# Patient Record
Sex: Male | Born: 1965 | Race: White | Hispanic: No | Marital: Married | Smoking: Never smoker
Health system: Southern US, Community
[De-identification: ages and names within clinical notes are randomized; demographics above are authoritative.]

## PROBLEM LIST (undated history)

## (undated) ENCOUNTER — Emergency Department (HOSPITAL_BASED_OUTPATIENT_CLINIC_OR_DEPARTMENT_OTHER): Admission: EM | Payer: Self-pay | Source: Home / Self Care

---

## 2013-06-13 ENCOUNTER — Encounter (HOSPITAL_BASED_OUTPATIENT_CLINIC_OR_DEPARTMENT_OTHER): Payer: Self-pay

## 2013-06-13 ENCOUNTER — Emergency Department (HOSPITAL_BASED_OUTPATIENT_CLINIC_OR_DEPARTMENT_OTHER): Payer: Self-pay

## 2013-06-13 ENCOUNTER — Emergency Department (HOSPITAL_BASED_OUTPATIENT_CLINIC_OR_DEPARTMENT_OTHER)
Admission: EM | Admit: 2013-06-13 | Discharge: 2013-06-13 | Disposition: A | Discharge status: Home / Self Care | Payer: Self-pay | Attending: Emergency Medicine | Admitting: Emergency Medicine

## 2013-06-13 DIAGNOSIS — S0990XA Unspecified injury of head, initial encounter: Secondary | ICD-10-CM

## 2013-06-13 DIAGNOSIS — Y9389 Activity, other specified: Secondary | ICD-10-CM | POA: Insufficient documentation

## 2013-06-13 DIAGNOSIS — B029 Zoster without complications: Secondary | ICD-10-CM | POA: Insufficient documentation

## 2013-06-13 DIAGNOSIS — W1789XA Other fall from one level to another, initial encounter: Secondary | ICD-10-CM | POA: Insufficient documentation

## 2013-06-13 DIAGNOSIS — Y929 Unspecified place or not applicable: Secondary | ICD-10-CM | POA: Insufficient documentation

## 2013-06-13 MED ORDER — PREDNISONE 10 MG PO TABS: 20.0000 mg | ORAL_TABLET | Freq: Two times a day (BID) | ORAL | Status: AC

## 2013-06-13 MED ORDER — VALACYCLOVIR HCL 1 G PO TABS: 1000.0000 mg | ORAL_TABLET | Freq: Three times a day (TID) | ORAL | Status: AC

## 2013-06-13 NOTE — ED Provider Notes (Signed)
   History    CSN: [id number] Arrival date & time 06/13/13  1401  First MD Initiated Contact with Patient 06/13/13 1409     Chief Complaint  Patient presents with  . Head Injury   (Consider location/radiation/quality/duration/timing/severity/associated sxs/prior Treatment) HPI Comments: Patient was sitting in a hammock 9 days ago when the hook came out of the tree and he fell to the ground.  He struck his head but was not knocked out.  He reports having a headache since that time.  No visual changes.  No nausea or vomiting.    Patient is a 47 y.o. male presenting with head injury. The history is provided by the patient.  Head Injury Location:  Occipital Time since incident:  9 days Mechanism of injury: fall   Pain details:    Quality:  Stabbing   Severity:  Moderate   Timing:  Constant   Progression:  Unchanged Chronicity:  New Relieved by:  Nothing Worsened by:  Nothing tried Ineffective treatments:  None tried  History reviewed. No pertinent past medical history. History reviewed. No pertinent past surgical history. No family history on file. History  Substance Use Topics  . Smoking status: Never Smoker   . Smokeless tobacco: Not on file  . Alcohol Use: No    Review of Systems  All other systems reviewed and are negative.    Allergies  Review of patient's allergies indicates no known allergies.  Home Medications  No current outpatient prescriptions on file. There were no vitals taken for this visit. Physical Exam  Nursing note and vitals reviewed. Constitutional: He is oriented to person, place, and time. He appears well-developed and well-nourished. No distress.  HENT:  Head: Normocephalic and atraumatic.  Mouth/Throat: Oropharynx is clear and moist.  Eyes: EOM are normal. Pupils are equal, round, and reactive to light.  Neck: Normal range of motion. Neck supple.  Cardiovascular: Regular rhythm.   Pulmonary/Chest: Effort normal.  Musculoskeletal: Normal  range of motion.  Lymphadenopathy:    He has no cervical adenopathy.  Neurological: He is alert and oriented to person, place, and time. No cranial nerve deficit. He exhibits normal muscle tone. Coordination normal.  Skin: Skin is warm and dry. He is not diaphoretic.  There is a vesicular rash to the right side of the occiput and upper neck.    ED Course  Procedures (including critical care time) Labs Reviewed - No data to display No results found. No diagnosis found.  MDM  The ct of the head is negative for traumatic injury.  There appears to be what I believe are lesions related to a zoster to the posterior aspect of the head in the same area where the pain is location.  I suspect this is likely the cause of the discomfort and that the fall was likely incidental.  Will treat with acyclovir and prednisone, discharge, return prn.  Geoffery Lyons, MD 06/13/13 [id number]

## 2013-06-13 NOTE — Discharge Instructions (Signed)
Concussion and Brain Injury  A blow or jolt to the head can disrupt the normal function of the brain. This type of brain injury is often called a "concussion" or a "closed head injury." Concussions are usually not life-threatening. Even so, the effects of a concussion can be serious.   CAUSES   A concussion is caused by a blunt blow to the head. The blow might be direct or indirect as described below.  · Direct blow (running into another player during a soccer game, being hit in a fight, or hitting your head on a hard surface).  · Indirect blow (when your head moves rapidly and violently back and forth like in a car crash).  SYMPTOMS   The brain is very complex. Every head injury is different. Some symptoms may appear right away. Other symptoms may not show up for days or weeks after the concussion. The signs of concussion can be hard to notice. Early on, problems may be missed by patients, family members, and caregivers. You may look fine even though you are acting or feeling differently.   These symptoms are usually temporary, but may last for days, weeks, or even longer. Symptoms include:  · Mild headaches that will not go away.  · Having more trouble than usual with:  · Remembering things.  · Paying attention or concentrating.  · Organizing daily tasks.  · Making decisions and solving problems.  · Slowness in thinking, acting, speaking, or reading.  · Getting lost or easily confused.  · Feeling tired all the time or lacking energy (fatigue).  · Feeling drowsy.  · Sleep disturbances.  · Sleeping more than usual.  · Sleeping less than usual.  · Trouble falling asleep.  · Trouble sleeping (insomnia).  · Loss of balance or feeling lightheaded or dizzy.  · Nausea or vomiting.  · Numbness or tingling.  · Increased sensitivity to:  · Sounds.  · Lights.  · Distractions.  Other symptoms might include:  · Vision problems or eyes that tire easily.  · Diminished sense of taste or smell.  · Ringing in the ears.  · Mood  changes such as feeling sad, anxious, or listless.  · Becoming easily irritated or angry for little or no reason.  · Lack of motivation.  DIAGNOSIS   Your caregiver can usually diagnose a concussion or mild brain injury based on your description of your injury and your symptoms.   Your evaluation might include:  · A brain scan to look for signs of injury to the brain. Even if the test shows no injury, you may still have a concussion.  · Blood tests to be sure other problems are not present.  TREATMENT   · People with a concussion need to be examined and evaluated. Most people with concussions are treated in an emergency department, urgent care, or clinic. Some people must stay in the hospital overnight for further treatment.  · Your caregiver will send you home with important instructions to follow. Be sure to carefully follow them.  · Tell your caregiver if you are already taking any medicines (prescription, over-the-counter, or natural remedies), or if you are drinking alcohol or taking illegal drugs. Also, talk with your caregiver if you are taking blood thinners (anticoagulants) or aspirin. These drugs may increase your chances of complications. All of this is important information that may affect treatment.  · Only take over-the-counter or prescription medicines for pain, discomfort, or fever as directed by your caregiver.  PROGNOSIS     How fast people recover from brain injury varies from person to person. Although most people have a good recovery, how quickly they improve depends on many factors. These factors include how severe their concussion was, what part of the brain was injured, their age, and how healthy they were before the concussion.   Because all head injuries are different, so is recovery. Most people with mild injuries recover fully. Recovery can take time. In general, recovery is slower in older persons. Also, persons who have had a concussion in the past or have other medical problems may find  that it takes longer to recover from their current injury. Anxiety and depression may also make it harder to adjust to the symptoms of brain injury.  HOME CARE INSTRUCTIONS   Return to your normal activities slowly, not all at once. You must give your body and brain enough time for recovery.  · Get plenty of sleep at night, and rest during the day. Rest helps the brain to heal.  · Avoid staying up late at night.  · Keep the same bedtime hours on weekends and weekdays.  · Take daytime naps or rest breaks when you feel tired.  · Limit activities that require a lot of thought or concentration (brain or cognitive rest). This includes:  · Homework or job-related work.  · Watching TV.  · Computer work.  · Avoid activities that could lead to a second brain injury, such as contact or recreational sports, until your caregiver says it is okay. Even after your brain injury has healed, you should protect yourself from having another concussion.  · Ask your caregiver when you can return to your normal activities such as driving, bicycling, or operating heavy equipment. Your ability to react may be slower after a brain injury.  · Talk with your caregiver about when you can return to work or school.  · Inform your teachers, school nurse, school counselor, coach, athletic trainer, or work manager about your injury, symptoms, and restrictions. They should be instructed to report:  · Increased problems with attention or concentration.  · Increased problems remembering or learning new information.  · Increased time needed to complete tasks or assignments.  · Increased irritability or decreased ability to cope with stress.  · Increased symptoms.  · Take only those medicines that your caregiver has approved.  · Do not drink alcohol until your caregiver says you are well enough to do so. Alcohol and certain other drugs may slow your recovery and can put you at risk of further injury.  · If it is harder than usual to remember things,  write them down.  · If you are easily distracted, try to do one thing at a time. For example, do not try to watch TV while fixing dinner.  · Talk with family members or close friends when making important decisions.  · Keep all follow-up appointments. Repeated evaluation of your symptoms is recommended for your recovery.  PREVENTION   Protect your head from future injury. It is very important to avoid another head or brain injury before you have recovered. In rare cases, another injury has lead to permanent brain damage, brain swelling, or death. Avoid injuries by using:  · Seatbelts when riding in a car.  · Alcohol only in moderation.  · A helmet when biking, skiing, skateboarding, skating, or doing similar activities.  · Safety measures in your home.  · Remove clutter and tripping hazards from floors and stairways.  · Use grab   Depression or mood swings.  Anxiety or irritability.  Memory problems.  Difficulty concentrating or paying attention.  Sleep problems.  Feeling tired all the time. SEEK IMMEDIATE MEDICAL CARE IF:  You have had a blow or jolt to the head and you (or your family or friends) notice:  Severe or worsening headaches.  Weakness (even if only in one hand or one leg or one part of the face), numbness, or decreased coordination.  Repeated vomiting.  Increased sleepiness or passing out.  One black center of the eye (pupil) is larger than the other.  Convulsions (seizures).  Slurred speech.  Increasing  confusion, restlessness, agitation, or irritability.  Lack of ability to recognize people or places.  Neck pain.  Difficulty being awakened.  Unusual behavior changes.  Loss of consciousness. Older adults with a brain injury may have a higher risk of serious complications such as a blood clot on the brain. Headaches that get worse or an increase in confusion are signs of this complication. If these signs occur, see a caregiver right away. MAKE SURE YOU:   Understand these instructions.  Will watch your condition.  Will get help right away if you are not doing well or get worse. FOR MORE INFORMATION  Several groups help people with brain injury and their families. They provide information and put people in touch with local resources. These include support groups, rehabilitation services, and a variety of health care professionals. Among these groups, the Brain Injury Association (BIA, www.biausa.org) has a Secretary/administrator that gathers scientific and educational information and works on a national level to help people with brain injury.  Document Released: 02/21/2004 Document Revised: 02/23/2012 Document Reviewed: 07/19/2008 [facility] Patient Information 2014 [place], [place].  Shingles Shingles (herpes zoster) is an infection that is caused by the same virus that causes chickenpox (varicella). The infection causes a painful skin rash and fluid-filled blisters, which eventually break open, crust over, and heal. It may occur in any area of the body, but it usually affects only one side of the body or face. The pain of shingles usually lasts about 1 month. However, some people with shingles may develop long-term (chronic) pain in the affected area of the body. Shingles often occurs many years after the person had chickenpox. It is more common:  In people older than 50 years.  In people with weakened immune systems, such as those with HIV, AIDS, or cancer.  In people taking medicines that  weaken the immune system, such as transplant medicines.  In people under great stress. CAUSES  Shingles is caused by the varicella zoster virus (VZV), which also causes chickenpox. After a person is infected with the virus, it can remain in the person's body for years in an inactive state (dormant). To cause shingles, the virus reactivates and breaks out as an infection in a nerve root. The virus can be spread from person to person (contagious) through contact with open blisters of the shingles rash. It will only spread to people who have not had chickenpox. When these people are exposed to the virus, they may develop chickenpox. They will not develop shingles. Once the blisters scab over, the person is no longer contagious and cannot spread the virus to others. SYMPTOMS  Shingles shows up in stages. The initial symptoms may be pain, itching, and tingling in an area of the skin. This pain is usually described as burning, stabbing, or throbbing.In a few days or weeks, a painful red rash will appear in the area where  the pain, itching, and tingling were felt. The rash is usually on one side of the body in a band or belt-like pattern. Then, the rash usually turns into fluid-filled blisters. They will scab over and dry up in approximately 2 3 weeks. Flu-like symptoms may also occur with the initial symptoms, the rash, or the blisters. These may include:  Fever.  Chills.  Headache.  Upset stomach. DIAGNOSIS  Your caregiver will perform a skin exam to diagnose shingles. Skin scrapings or fluid samples may also be taken from the blisters. This sample will be examined under a microscope or sent to a lab for further testing. TREATMENT  There is no specific cure for shingles. Your caregiver will likely prescribe medicines to help you manage the pain, recover faster, and avoid long-term problems. This may include antiviral drugs, anti-inflammatory drugs, and pain medicines. HOME CARE INSTRUCTIONS   Take  a cool bath or apply cool compresses to the area of the rash or blisters as directed. This may help with the pain and itching.   Only take over-the-counter or prescription medicines as directed by your caregiver.   Rest as directed by your caregiver.  Keep your rash and blisters clean with mild soap and cool water or as directed by your caregiver.  Do not pick your blisters or scratch your rash. Apply an anti-itch cream or numbing creams to the affected area as directed by your caregiver.  Keep your shingles rash covered with a loose bandage (dressing).  Avoid skin contact with:  Babies.   Pregnant women.   Children with eczema.   Elderly people with transplants.   People with chronic illnesses, such as leukemia or AIDS.   Wear loose-fitting clothing to help ease the pain of material rubbing against the rash.  Keep all follow-up appointments with your caregiver.If the area involved is on your face, you may receive a referral for follow-up to a specialist, such as an eye doctor (ophthalmologist) or an ear, nose, and throat (ENT) doctor. Keeping all follow-up appointments will help you avoid eye complications, chronic pain, or disability.  SEEK IMMEDIATE MEDICAL CARE IF:   You have facial pain, pain around the eye area, or loss of feeling on one side of your face.  You have ear pain or ringing in your ear.  You have loss of taste.  Your pain is not relieved with prescribed medicines.   Your redness or swelling spreads.   You have more pain and swelling.  Your condition is worsening or has changed.   You have a feveror persistent symptoms for more than 2 3 days.  You have a fever and your symptoms suddenly get worse. MAKE SURE YOU:  Understand these instructions.  Will watch your condition.  Will get help right away if you are not doing well or get worse. Document Released: 12/01/2005 Document Revised: 08/25/2012 Document Reviewed:  07/15/2012 [facility] Patient Information 2014 [place], [place].

## 2013-06-13 NOTE — ED Notes (Signed)
Fell from hammock 9 days ago-no LOC-c/o HA since the fall

## 2013-09-15 IMAGING — CT CT HEAD W/O CM
1 series · 16 of 30 positions shown, 20 images · non-contrast
Comparison: None

CLINICAL DATA: Headache, fall, head trauma

CT HEAD WITHOUT CONTRAST
TECHNIQUE: Contiguous axial images were obtained from the base of
the skull through the vertex without contrast.

[Series 2: head 4.8 h37s · axial · 0.47mm/px · z∈[+1195,+1355]mm · 16 of 36 slices shown, 20 images]
[im 2/36  brain]
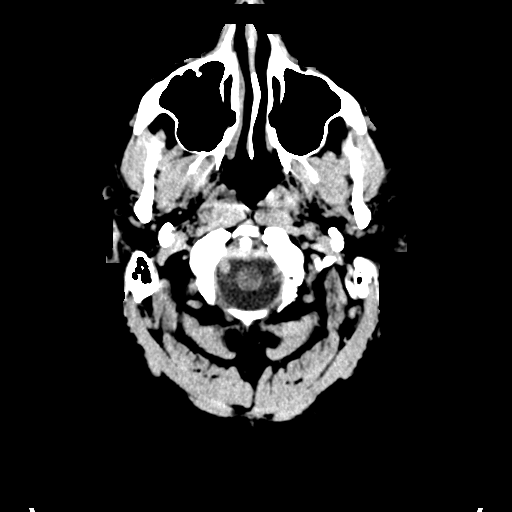
[im 2/36  bone]
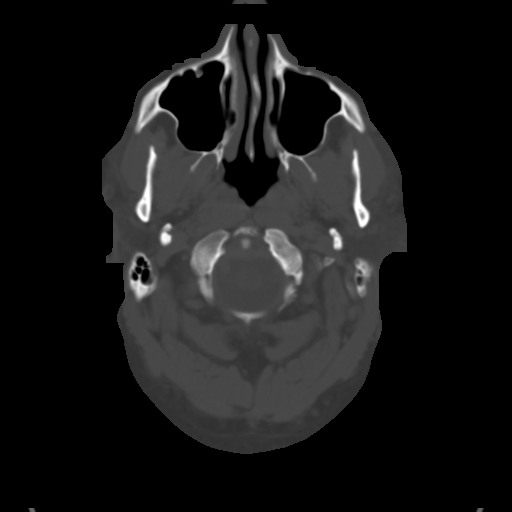
[im 4/36  brain]
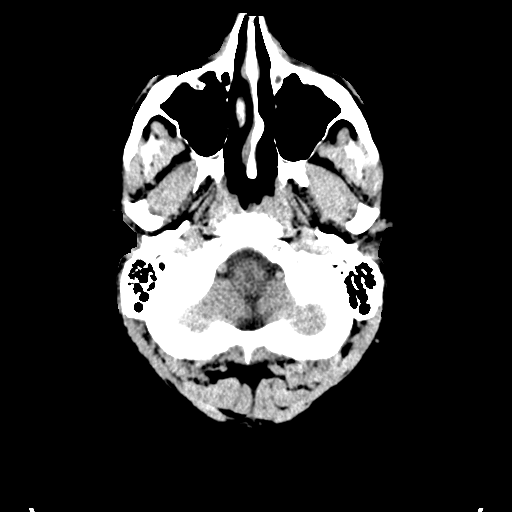
[im 7/36  brain]
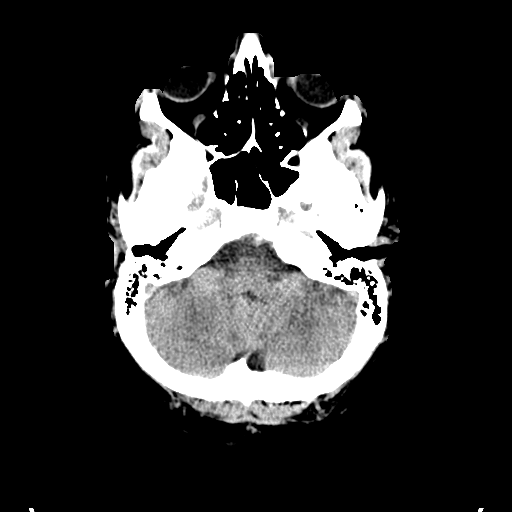
[im 9/36  brain]
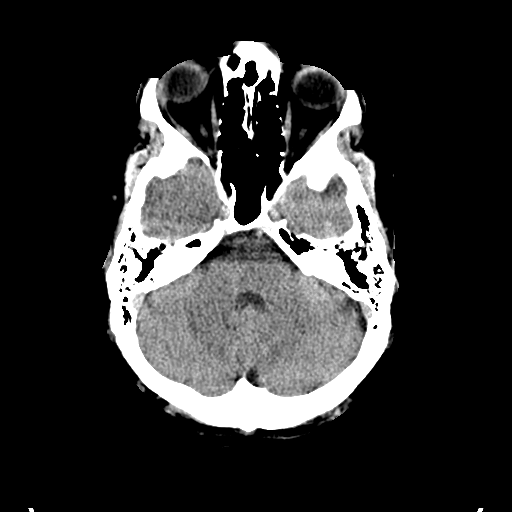
[im 10/36  brain]
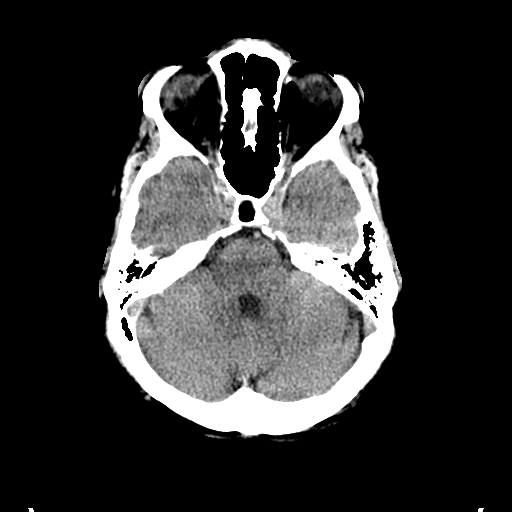
[im 10/36  bone]
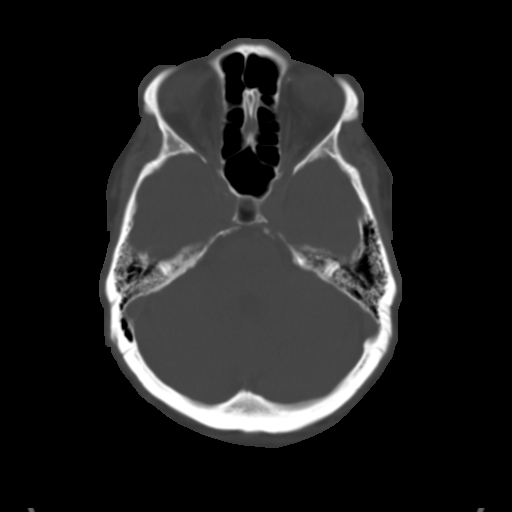
[im 13/36  brain]
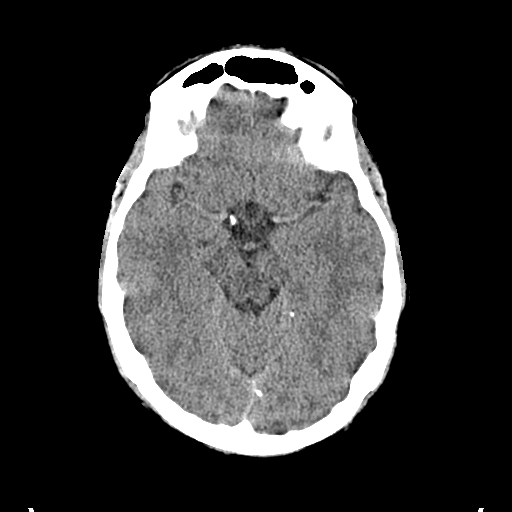
[im 15/36  brain]
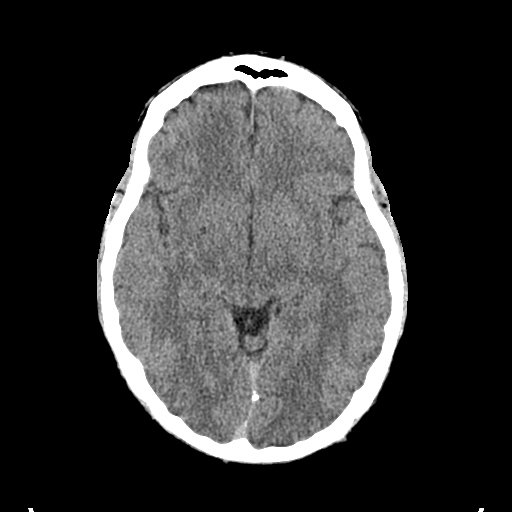
[im 17/36  brain]
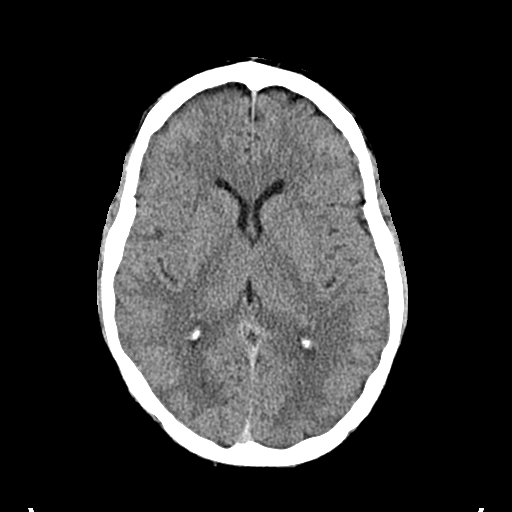
[im 19/36  brain]
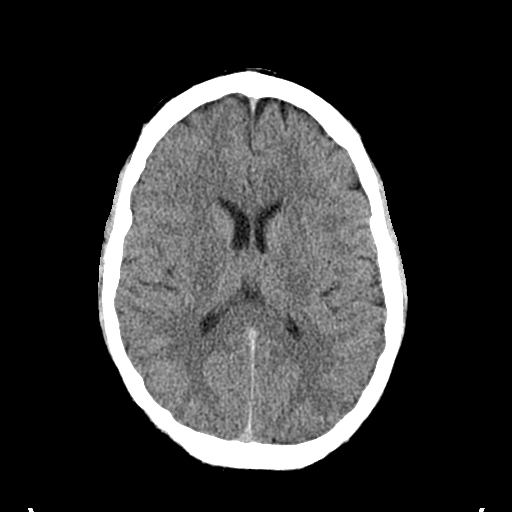
[im 19/36  bone]
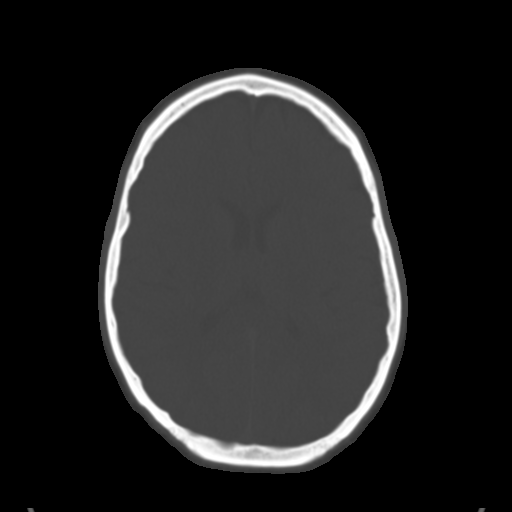
[im 21/36  brain]
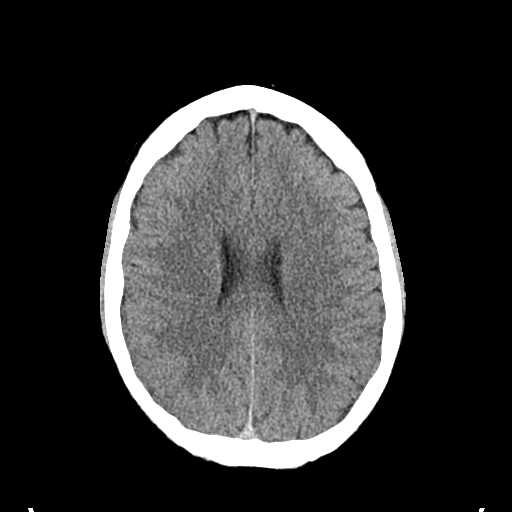
[im 23/36  brain]
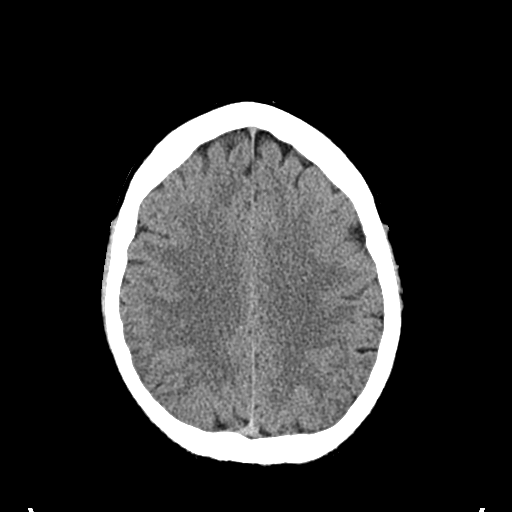
[im 26/36  brain]
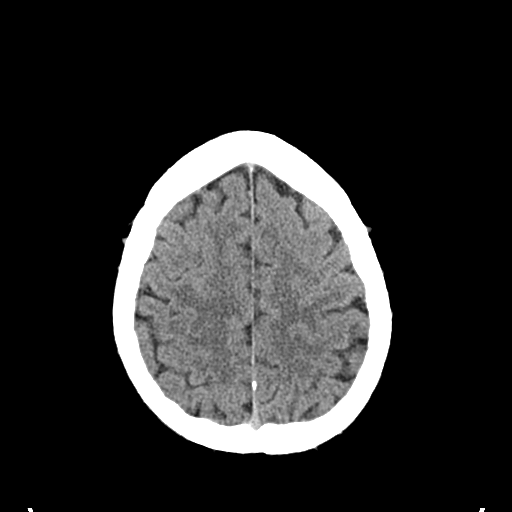
[im 27/36  brain]
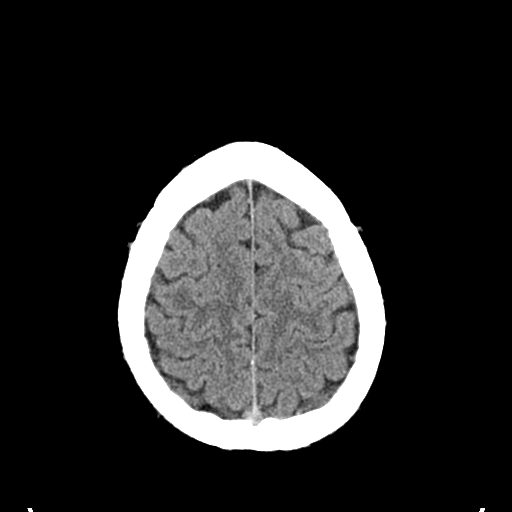
[im 27/36  bone]
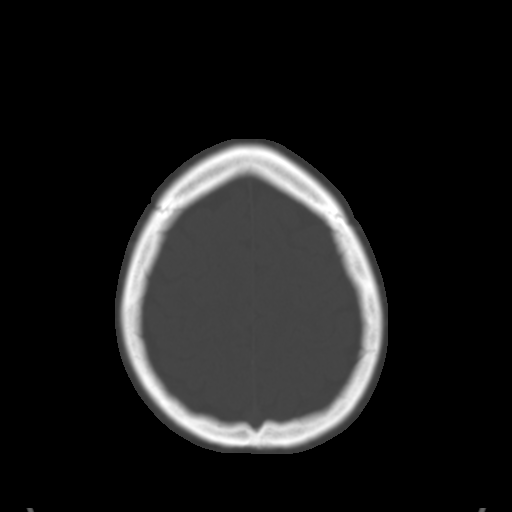
[im 29/36  brain]
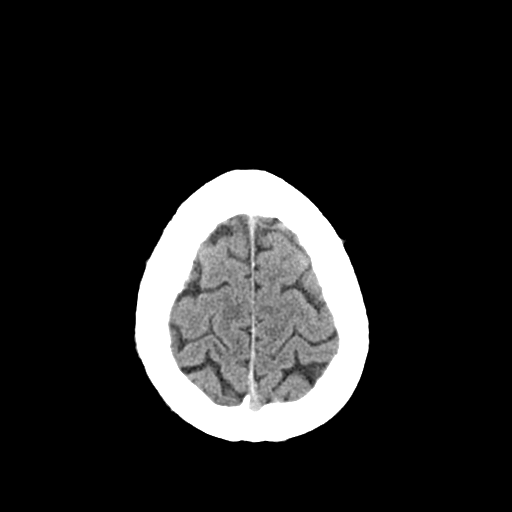
[im 32/36  brain]
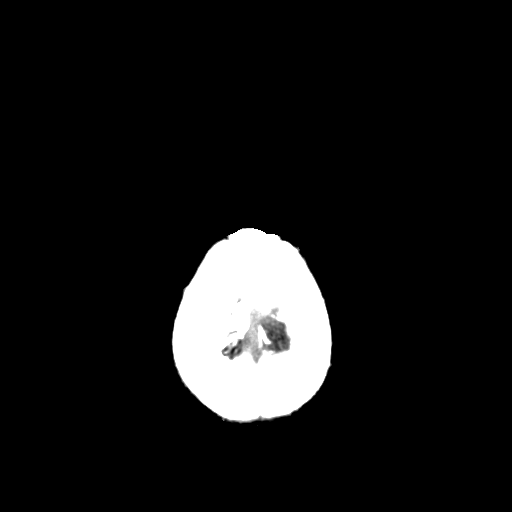
[im 34/36  brain]
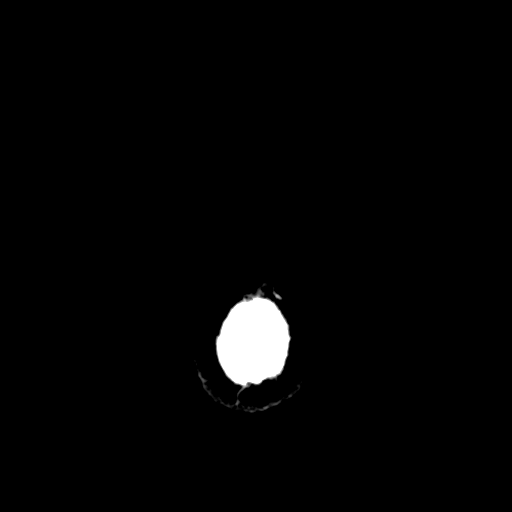

[16 of 30 positions shown; findings below may reference images not displayed]

FINDINGS: No skull fracture is noted.  Paranasal sinuses and mastoid air
cells are unremarkable.

No intracranial hemorrhage, mass effect or midline shift.

No acute infarction.  No mass lesion is noted on this unenhanced
scan.
IMPRESSION: No acute intracranial abnormality.

## 2018-03-22 DIAGNOSIS — R109 Unspecified abdominal pain: Secondary | ICD-10-CM | POA: Diagnosis not present

## 2018-03-22 DIAGNOSIS — E119 Type 2 diabetes mellitus without complications: Secondary | ICD-10-CM | POA: Diagnosis not present

## 2018-03-22 DIAGNOSIS — E781 Pure hyperglyceridemia: Secondary | ICD-10-CM | POA: Diagnosis not present

## 2018-03-22 DIAGNOSIS — I10 Essential (primary) hypertension: Secondary | ICD-10-CM | POA: Diagnosis not present

## 2018-04-12 DIAGNOSIS — E119 Type 2 diabetes mellitus without complications: Secondary | ICD-10-CM | POA: Diagnosis not present

## 2018-04-12 DIAGNOSIS — H5203 Hypermetropia, bilateral: Secondary | ICD-10-CM | POA: Diagnosis not present

## 2019-02-28 DIAGNOSIS — R079 Chest pain, unspecified: Secondary | ICD-10-CM | POA: Diagnosis not present

## 2019-02-28 DIAGNOSIS — Z125 Encounter for screening for malignant neoplasm of prostate: Secondary | ICD-10-CM | POA: Diagnosis not present

## 2019-02-28 DIAGNOSIS — I10 Essential (primary) hypertension: Secondary | ICD-10-CM | POA: Diagnosis not present

## 2019-02-28 DIAGNOSIS — E781 Pure hyperglyceridemia: Secondary | ICD-10-CM | POA: Diagnosis not present

## 2019-02-28 DIAGNOSIS — R740 Nonspecific elevation of levels of transaminase and lactic acid dehydrogenase [LDH]: Secondary | ICD-10-CM | POA: Diagnosis not present

## 2019-02-28 DIAGNOSIS — E119 Type 2 diabetes mellitus without complications: Secondary | ICD-10-CM | POA: Diagnosis not present

## 2019-02-28 DIAGNOSIS — Z7984 Long term (current) use of oral hypoglycemic drugs: Secondary | ICD-10-CM | POA: Diagnosis not present

## 2019-08-03 DIAGNOSIS — E119 Type 2 diabetes mellitus without complications: Secondary | ICD-10-CM | POA: Diagnosis not present

## 2019-08-03 DIAGNOSIS — D751 Secondary polycythemia: Secondary | ICD-10-CM | POA: Diagnosis not present

## 2019-08-03 DIAGNOSIS — E781 Pure hyperglyceridemia: Secondary | ICD-10-CM | POA: Diagnosis not present

## 2019-08-03 DIAGNOSIS — R809 Proteinuria, unspecified: Secondary | ICD-10-CM | POA: Diagnosis not present

## 2019-08-03 DIAGNOSIS — Z1331 Encounter for screening for depression: Secondary | ICD-10-CM | POA: Diagnosis not present

## 2019-08-04 DIAGNOSIS — Z0000 Encounter for general adult medical examination without abnormal findings: Secondary | ICD-10-CM | POA: Diagnosis not present

## 2019-08-04 DIAGNOSIS — I10 Essential (primary) hypertension: Secondary | ICD-10-CM | POA: Diagnosis not present

## 2019-08-04 DIAGNOSIS — E1169 Type 2 diabetes mellitus with other specified complication: Secondary | ICD-10-CM | POA: Diagnosis not present

## 2019-08-04 DIAGNOSIS — Z23 Encounter for immunization: Secondary | ICD-10-CM | POA: Diagnosis not present

## 2019-08-04 DIAGNOSIS — E119 Type 2 diabetes mellitus without complications: Secondary | ICD-10-CM | POA: Diagnosis not present

## 2019-08-04 DIAGNOSIS — E7849 Other hyperlipidemia: Secondary | ICD-10-CM | POA: Diagnosis not present

## 2019-08-11 DIAGNOSIS — E781 Pure hyperglyceridemia: Secondary | ICD-10-CM | POA: Diagnosis not present

## 2019-08-11 DIAGNOSIS — I10 Essential (primary) hypertension: Secondary | ICD-10-CM | POA: Diagnosis not present

## 2019-08-11 DIAGNOSIS — R809 Proteinuria, unspecified: Secondary | ICD-10-CM | POA: Diagnosis not present

## 2019-08-11 DIAGNOSIS — E1169 Type 2 diabetes mellitus with other specified complication: Secondary | ICD-10-CM | POA: Diagnosis not present

## 2019-09-14 DIAGNOSIS — I10 Essential (primary) hypertension: Secondary | ICD-10-CM | POA: Diagnosis not present

## 2019-09-14 DIAGNOSIS — E1169 Type 2 diabetes mellitus with other specified complication: Secondary | ICD-10-CM | POA: Diagnosis not present

## 2019-09-14 DIAGNOSIS — E781 Pure hyperglyceridemia: Secondary | ICD-10-CM | POA: Diagnosis not present

## 2019-10-06 ENCOUNTER — Other Ambulatory Visit: Payer: Self-pay

## 2019-10-06 DIAGNOSIS — Z20822 Contact with and (suspected) exposure to covid-19: Secondary | ICD-10-CM

## 2019-10-08 LAB — NOVEL CORONAVIRUS, NAA: SARS-CoV-2, NAA: NOT DETECTED

## 2019-10-14 DIAGNOSIS — E119 Type 2 diabetes mellitus without complications: Secondary | ICD-10-CM | POA: Diagnosis not present

## 2019-10-14 DIAGNOSIS — H524 Presbyopia: Secondary | ICD-10-CM | POA: Diagnosis not present

## 2019-10-28 DIAGNOSIS — Z0000 Encounter for general adult medical examination without abnormal findings: Secondary | ICD-10-CM | POA: Diagnosis not present

## 2019-10-28 DIAGNOSIS — E7849 Other hyperlipidemia: Secondary | ICD-10-CM | POA: Diagnosis not present

## 2019-10-28 DIAGNOSIS — E1169 Type 2 diabetes mellitus with other specified complication: Secondary | ICD-10-CM | POA: Diagnosis not present

## 2019-10-28 DIAGNOSIS — Z125 Encounter for screening for malignant neoplasm of prostate: Secondary | ICD-10-CM | POA: Diagnosis not present

## 2019-11-04 DIAGNOSIS — Z0000 Encounter for general adult medical examination without abnormal findings: Secondary | ICD-10-CM | POA: Diagnosis not present

## 2019-11-04 DIAGNOSIS — R7989 Other specified abnormal findings of blood chemistry: Secondary | ICD-10-CM | POA: Diagnosis not present

## 2019-11-04 DIAGNOSIS — R82998 Other abnormal findings in urine: Secondary | ICD-10-CM | POA: Diagnosis not present

## 2019-11-04 DIAGNOSIS — E785 Hyperlipidemia, unspecified: Secondary | ICD-10-CM | POA: Diagnosis not present

## 2019-11-04 DIAGNOSIS — I10 Essential (primary) hypertension: Secondary | ICD-10-CM | POA: Diagnosis not present

## 2019-11-04 DIAGNOSIS — E1169 Type 2 diabetes mellitus with other specified complication: Secondary | ICD-10-CM | POA: Diagnosis not present

## 2019-11-07 ENCOUNTER — Other Ambulatory Visit: Payer: Self-pay | Admitting: Internal Medicine

## 2019-11-07 DIAGNOSIS — R7989 Other specified abnormal findings of blood chemistry: Secondary | ICD-10-CM

## 2019-11-15 ENCOUNTER — Ambulatory Visit
Admission: RE | Admit: 2019-11-15 | Discharge: 2019-11-15 | Disposition: A | Discharge status: Home / Self Care | Payer: BC Managed Care – PPO | Source: Ambulatory Visit | Attending: Internal Medicine | Admitting: Internal Medicine

## 2019-11-15 DIAGNOSIS — K7689 Other specified diseases of liver: Secondary | ICD-10-CM | POA: Diagnosis not present

## 2019-11-15 DIAGNOSIS — R7989 Other specified abnormal findings of blood chemistry: Secondary | ICD-10-CM

## 2019-12-06 DIAGNOSIS — Z1212 Encounter for screening for malignant neoplasm of rectum: Secondary | ICD-10-CM | POA: Diagnosis not present

## 2020-02-10 DIAGNOSIS — I10 Essential (primary) hypertension: Secondary | ICD-10-CM | POA: Diagnosis not present

## 2020-02-10 DIAGNOSIS — E1169 Type 2 diabetes mellitus with other specified complication: Secondary | ICD-10-CM | POA: Diagnosis not present

## 2020-02-10 DIAGNOSIS — E781 Pure hyperglyceridemia: Secondary | ICD-10-CM | POA: Diagnosis not present

## 2020-02-10 DIAGNOSIS — R7989 Other specified abnormal findings of blood chemistry: Secondary | ICD-10-CM | POA: Diagnosis not present

## 2020-02-17 IMAGING — US US ABDOMEN LIMITED
1 series · 14 of 25 positions shown · non-contrast
Comparison: None.

CLINICAL DATA: Elevated liver enzymes

EXAM:
ULTRASOUND ABDOMEN LIMITED RIGHT UPPER QUADRANT

[Series 1: us abdomen limited · 0.20mm/px · 14 of 49 slices shown]
[im 1/49]
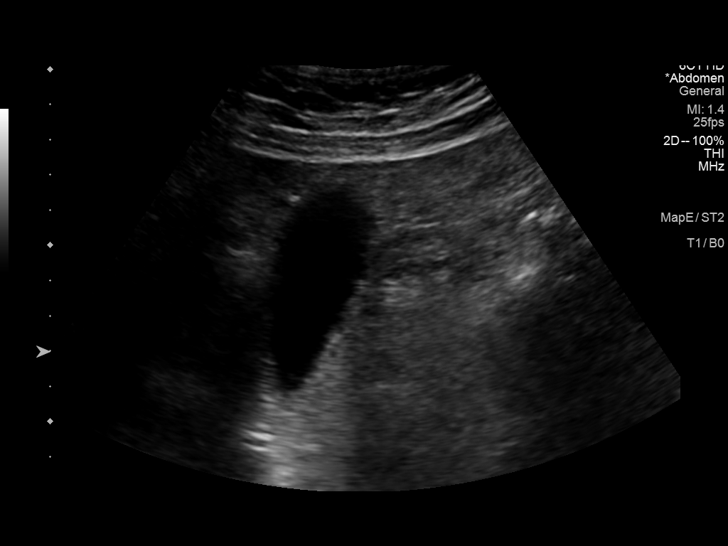
[im 5/49]
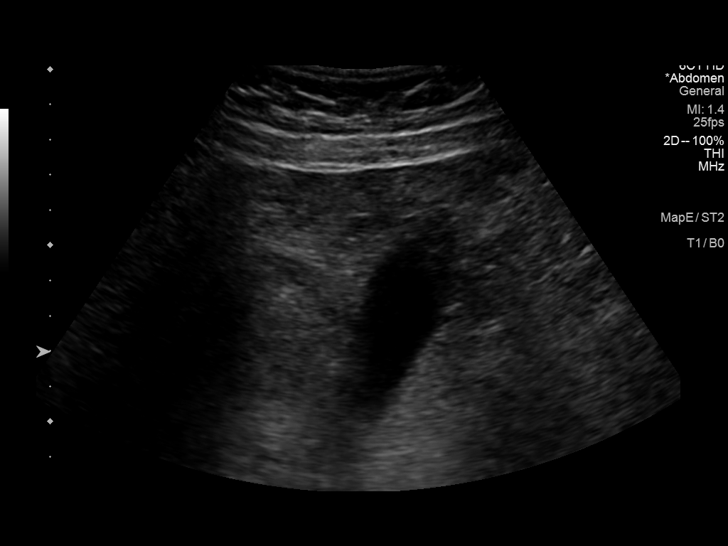
[im 9/49]
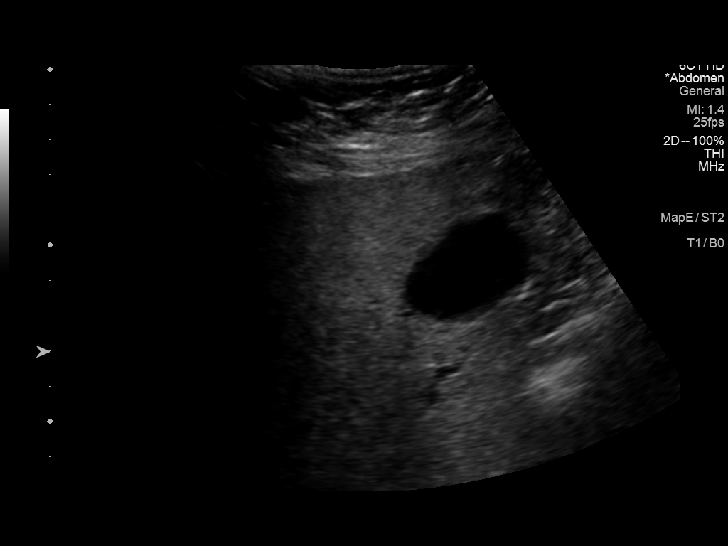
[im 13/49]
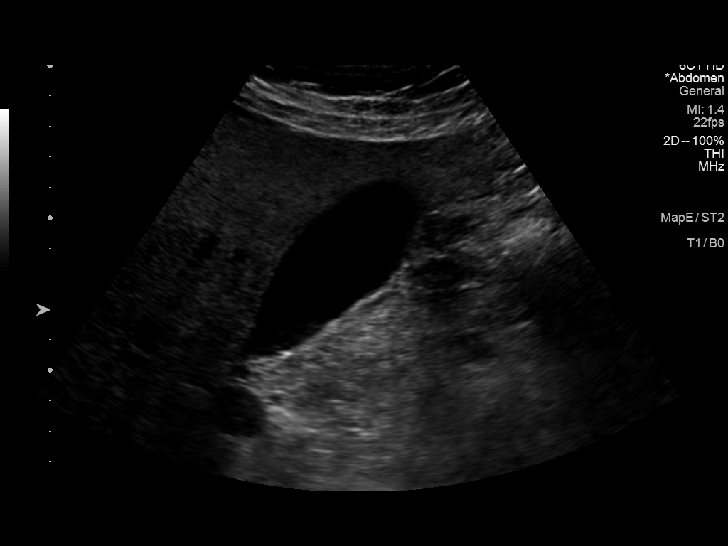
[im 17/49]
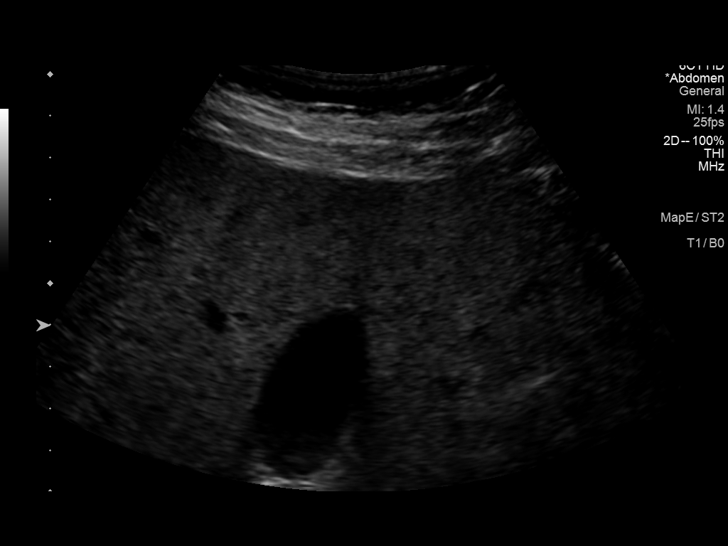
[im 19/49]
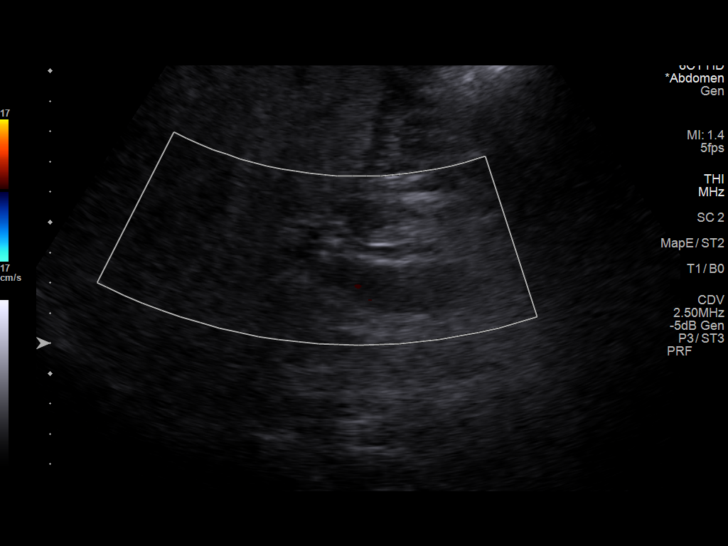
[im 23/49]
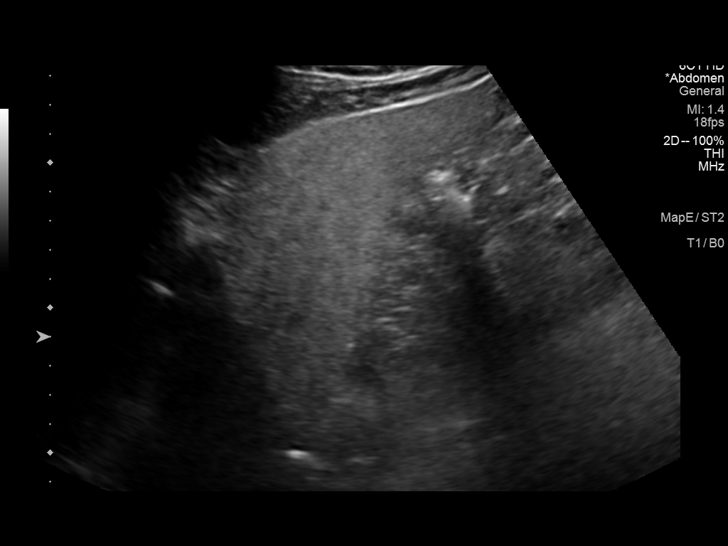
[im 27/49]
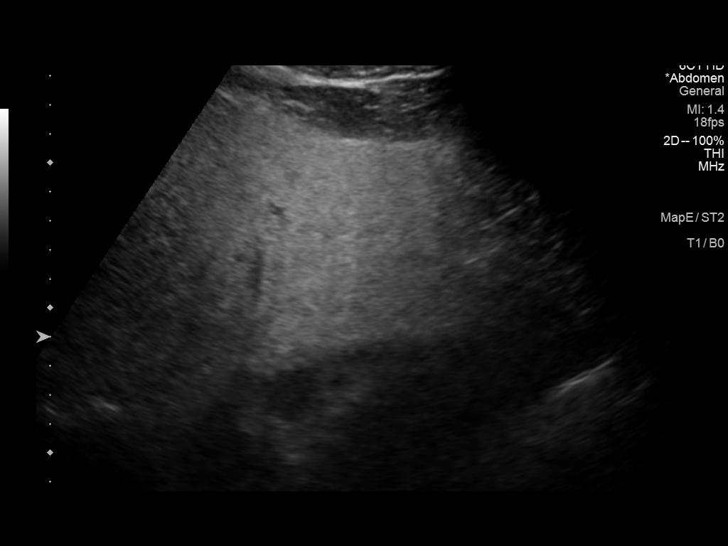
[im 31/49]
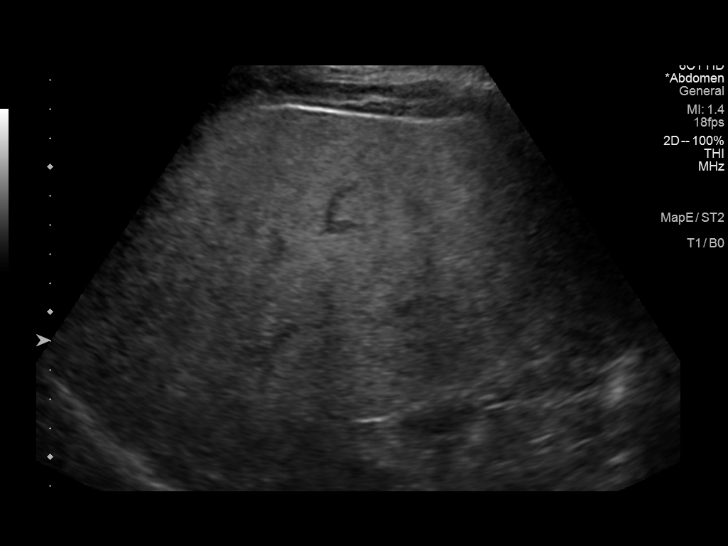
[im 33/49]
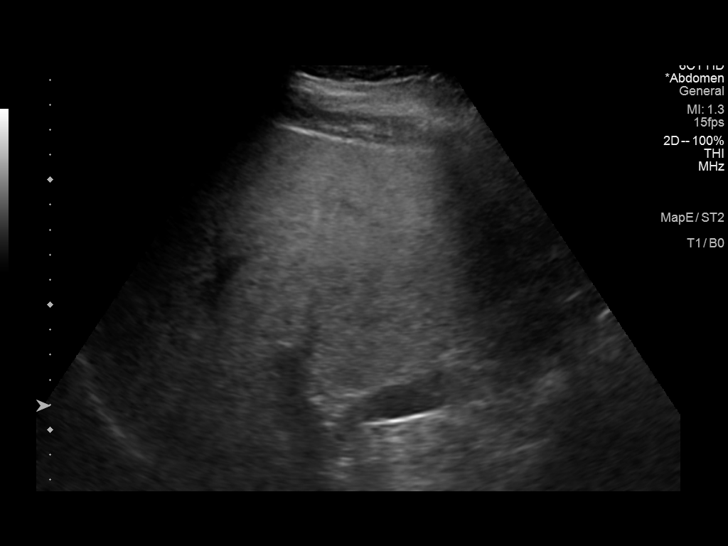
[im 37/49]
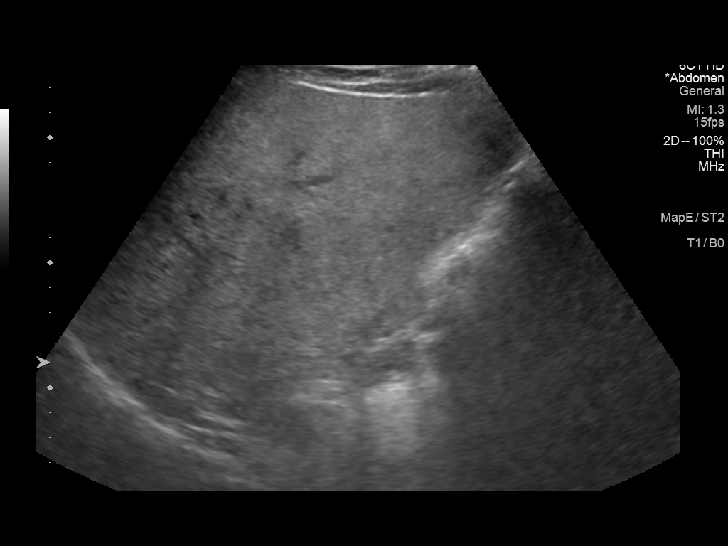
[im 41/49]
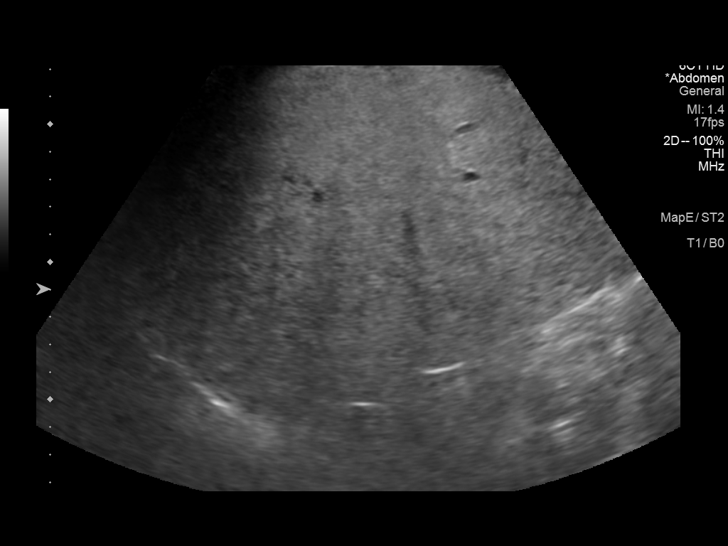
[im 45/49]
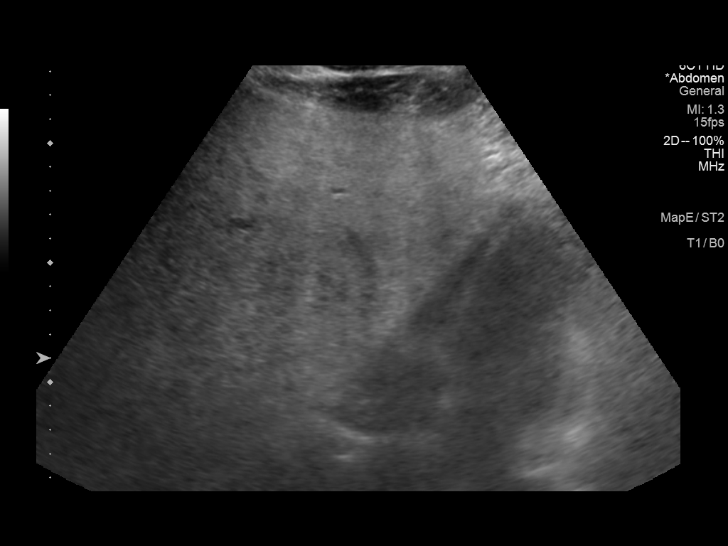
[im 49/49]
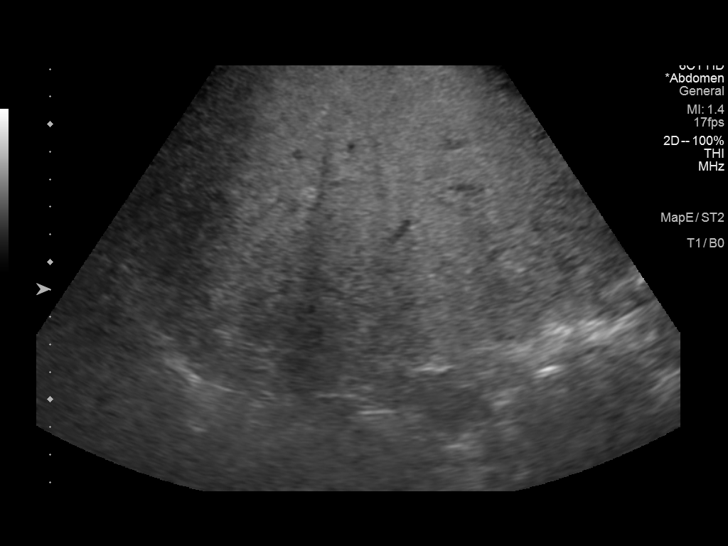

[14 of 25 positions shown; findings below may reference images not displayed]

FINDINGS: Gallbladder:

No gallstones or wall thickening visualized. There is no
pericholecystic fluid. No sonographic Murphy sign noted by
sonographer.

Common bile duct:

Diameter: 5 mm. No intrahepatic or extrahepatic biliary duct
dilatation.

Liver:

No focal lesion identified. Liver echogenicity overall is increased.
Portal vein is patent on color Doppler imaging with normal direction
of blood flow towards the liver.

Other: None.
IMPRESSION: Increased liver echogenicity, a finding indicative of hepatic
steatosis. While no focal liver lesions are evident on this study,
it must be cautioned that the sensitivity of ultrasound for
detection focal liver lesions is diminished in this circumstance.

Study otherwise unremarkable.

## 2020-02-25 ENCOUNTER — Ambulatory Visit: Payer: BLUE CROSS/BLUE SHIELD | Attending: Internal Medicine

## 2020-02-25 DIAGNOSIS — Z23 Encounter for immunization: Secondary | ICD-10-CM

## 2020-02-25 NOTE — Progress Notes (Signed)
   Covid-19 Vaccination Clinic  Name:  Usiel Astarita    MRN: [id number] DOB: 1966-09-11  02/25/2020  Mr. Lyon was observed post Covid-19 immunization for 15 minutes without incident. He was provided with Vaccine Information Sheet and instruction to access the V-Safe system.   Mr. Vences was instructed to call 911 with any severe reactions post vaccine: Marland Kitchen Difficulty breathing  . Swelling of face and throat  . A fast heartbeat  . A bad rash all over body  . Dizziness and weakness   Immunizations Administered    Name Date Dose VIS Date Route   Pfizer COVID-19 Vaccine 02/25/2020  3:43 PM 0.3 mL 11/25/2019 Intramuscular   Manufacturer: [employer], [employer]   Lot: [id number]   NDC: [address]-4

## 2020-02-28 DIAGNOSIS — M7090 Unspecified soft tissue disorder related to use, overuse and pressure of unspecified site: Secondary | ICD-10-CM | POA: Diagnosis not present

## 2020-02-28 DIAGNOSIS — E1169 Type 2 diabetes mellitus with other specified complication: Secondary | ICD-10-CM | POA: Diagnosis not present

## 2020-02-28 DIAGNOSIS — R1032 Left lower quadrant pain: Secondary | ICD-10-CM | POA: Diagnosis not present

## 2020-03-21 ENCOUNTER — Ambulatory Visit: Payer: BLUE CROSS/BLUE SHIELD | Attending: Internal Medicine

## 2020-03-21 DIAGNOSIS — Z23 Encounter for immunization: Secondary | ICD-10-CM

## 2020-03-21 NOTE — Progress Notes (Signed)
   Covid-19 Vaccination Clinic  Name:  Donald Carrillo    MRN: [id number] DOB: [birth year]  03/21/2020  [name] was observed post Covid-19 immunization for 15 minutes without incident. He was provided with Vaccine Information Sheet and instruction to access the V-Safe system.   [name] was instructed to call 911 with any severe reactions post vaccine: Marland Kitchen Difficulty breathing  . Swelling of face and throat  . A fast heartbeat  . A bad rash all over body  . Dizziness and weakness   Immunizations Administered    Name Date Dose VIS Date Route   Pfizer COVID-19 Vaccine 03/21/2020 11:44 AM 0.3 mL 11/25/2019 Intramuscular   Manufacturer: [employer], [employer]   Lot: [id number]   NDC: [address]-8

## 2020-08-09 DIAGNOSIS — I10 Essential (primary) hypertension: Secondary | ICD-10-CM | POA: Diagnosis not present

## 2020-08-09 DIAGNOSIS — E1169 Type 2 diabetes mellitus with other specified complication: Secondary | ICD-10-CM | POA: Diagnosis not present

## 2020-10-15 DIAGNOSIS — E119 Type 2 diabetes mellitus without complications: Secondary | ICD-10-CM | POA: Diagnosis not present

## 2020-10-15 DIAGNOSIS — H26102 Unspecified traumatic cataract, left eye: Secondary | ICD-10-CM | POA: Diagnosis not present

## 2020-10-15 DIAGNOSIS — H524 Presbyopia: Secondary | ICD-10-CM | POA: Diagnosis not present

## 2020-11-12 DIAGNOSIS — E785 Hyperlipidemia, unspecified: Secondary | ICD-10-CM | POA: Diagnosis not present

## 2020-11-12 DIAGNOSIS — R8281 Pyuria: Secondary | ICD-10-CM | POA: Diagnosis not present

## 2020-11-12 DIAGNOSIS — Z0000 Encounter for general adult medical examination without abnormal findings: Secondary | ICD-10-CM | POA: Diagnosis not present

## 2020-11-12 DIAGNOSIS — D751 Secondary polycythemia: Secondary | ICD-10-CM | POA: Diagnosis not present

## 2020-11-12 DIAGNOSIS — Z125 Encounter for screening for malignant neoplasm of prostate: Secondary | ICD-10-CM | POA: Diagnosis not present

## 2020-11-12 DIAGNOSIS — E1169 Type 2 diabetes mellitus with other specified complication: Secondary | ICD-10-CM | POA: Diagnosis not present

## 2020-11-12 DIAGNOSIS — R7989 Other specified abnormal findings of blood chemistry: Secondary | ICD-10-CM | POA: Diagnosis not present

## 2020-11-12 DIAGNOSIS — R82998 Other abnormal findings in urine: Secondary | ICD-10-CM | POA: Diagnosis not present

## 2021-05-14 DIAGNOSIS — E1169 Type 2 diabetes mellitus with other specified complication: Secondary | ICD-10-CM | POA: Diagnosis not present

## 2021-11-08 DIAGNOSIS — E1169 Type 2 diabetes mellitus with other specified complication: Secondary | ICD-10-CM | POA: Diagnosis not present

## 2021-11-08 DIAGNOSIS — R0981 Nasal congestion: Secondary | ICD-10-CM | POA: Diagnosis not present

## 2021-11-08 DIAGNOSIS — R051 Acute cough: Secondary | ICD-10-CM | POA: Diagnosis not present

## 2021-11-22 DIAGNOSIS — Z125 Encounter for screening for malignant neoplasm of prostate: Secondary | ICD-10-CM | POA: Diagnosis not present

## 2021-11-22 DIAGNOSIS — E1169 Type 2 diabetes mellitus with other specified complication: Secondary | ICD-10-CM | POA: Diagnosis not present

## 2021-11-28 ENCOUNTER — Other Ambulatory Visit (HOSPITAL_COMMUNITY): Payer: Self-pay

## 2021-11-28 DIAGNOSIS — D751 Secondary polycythemia: Secondary | ICD-10-CM | POA: Diagnosis not present

## 2021-11-28 DIAGNOSIS — Z0000 Encounter for general adult medical examination without abnormal findings: Secondary | ICD-10-CM | POA: Diagnosis not present

## 2021-11-28 DIAGNOSIS — Z1331 Encounter for screening for depression: Secondary | ICD-10-CM | POA: Diagnosis not present

## 2021-11-28 DIAGNOSIS — I10 Essential (primary) hypertension: Secondary | ICD-10-CM | POA: Diagnosis not present

## 2021-11-28 DIAGNOSIS — Z1339 Encounter for screening examination for other mental health and behavioral disorders: Secondary | ICD-10-CM | POA: Diagnosis not present

## 2021-11-28 MED ORDER — OZEMPIC (1 MG/DOSE) 4 MG/3ML ~~LOC~~ SOPN
1.0000 mg | PEN_INJECTOR | SUBCUTANEOUS | 4 refills | Status: DC
  Filled 2022-01-15: qty 3, 28d supply, fill #0
  Filled 2022-02-06: qty 3, 28d supply, fill #1
  Filled 2022-03-07: qty 3, 28d supply, fill #2

## 2021-11-28 MED ORDER — OZEMPIC (0.25 OR 0.5 MG/DOSE) 2 MG/1.5ML ~~LOC~~ SOPN
0.5000 mg | PEN_INJECTOR | SUBCUTANEOUS | 6 refills | Status: DC
  Filled 2021-11-28 – 2021-12-18 (×2): qty 1.5, 28d supply, fill #0

## 2021-11-28 MED ORDER — INSULIN PEN NEEDLE 31G X 5 MM MISC
4 refills | Status: AC
  Filled 2021-11-28: qty 100, 90d supply, fill #0
  Filled 2021-12-18: qty 100, 30d supply, fill #0

## 2021-12-10 ENCOUNTER — Other Ambulatory Visit (HOSPITAL_COMMUNITY): Payer: Self-pay

## 2021-12-11 DIAGNOSIS — Z0000 Encounter for general adult medical examination without abnormal findings: Secondary | ICD-10-CM | POA: Diagnosis not present

## 2021-12-18 ENCOUNTER — Other Ambulatory Visit (HOSPITAL_COMMUNITY): Payer: Self-pay

## 2022-01-15 ENCOUNTER — Other Ambulatory Visit (HOSPITAL_COMMUNITY): Payer: Self-pay

## 2022-02-06 ENCOUNTER — Other Ambulatory Visit (HOSPITAL_COMMUNITY): Payer: Self-pay

## 2022-02-20 DIAGNOSIS — Z1211 Encounter for screening for malignant neoplasm of colon: Secondary | ICD-10-CM | POA: Diagnosis not present

## 2022-02-20 DIAGNOSIS — E119 Type 2 diabetes mellitus without complications: Secondary | ICD-10-CM | POA: Diagnosis not present

## 2022-02-20 DIAGNOSIS — E782 Mixed hyperlipidemia: Secondary | ICD-10-CM | POA: Diagnosis not present

## 2022-02-20 DIAGNOSIS — I10 Essential (primary) hypertension: Secondary | ICD-10-CM | POA: Diagnosis not present

## 2022-02-21 ENCOUNTER — Other Ambulatory Visit (HOSPITAL_COMMUNITY): Payer: Self-pay

## 2022-02-21 MED ORDER — CLENPIQ 10-3.5-12 MG-GM -GM/160ML PO SOLN
160.0000 mL | Freq: Two times a day (BID) | ORAL | 0 refills | Status: AC
  Filled 2022-02-21 – 2022-03-07 (×5): qty 320, 1d supply, fill #0

## 2022-03-05 ENCOUNTER — Other Ambulatory Visit (HOSPITAL_COMMUNITY): Payer: Self-pay

## 2022-03-07 ENCOUNTER — Other Ambulatory Visit (HOSPITAL_COMMUNITY): Payer: Self-pay

## 2022-03-10 ENCOUNTER — Other Ambulatory Visit (HOSPITAL_COMMUNITY): Payer: Self-pay

## 2022-03-10 MED ORDER — NA SULFATE-K SULFATE-MG SULF 17.5-3.13-1.6 GM/177ML PO SOLN
ORAL | 0 refills | Status: AC
  Filled 2022-03-10: qty 354, 1d supply, fill #0

## 2022-03-10 MED ORDER — CLENPIQ 10-3.5-12 MG-GM -GM/160ML PO SOLN
ORAL | 0 refills | Status: AC
  Filled 2022-03-10: qty 320, 1d supply, fill #0

## 2022-03-12 DIAGNOSIS — K635 Polyp of colon: Secondary | ICD-10-CM | POA: Diagnosis not present

## 2022-03-12 DIAGNOSIS — R195 Other fecal abnormalities: Secondary | ICD-10-CM | POA: Diagnosis not present

## 2022-03-12 DIAGNOSIS — K5140 Inflammatory polyps of colon without complications: Secondary | ICD-10-CM | POA: Diagnosis not present

## 2022-03-12 DIAGNOSIS — Z1211 Encounter for screening for malignant neoplasm of colon: Secondary | ICD-10-CM | POA: Diagnosis not present

## 2022-03-12 DIAGNOSIS — K649 Unspecified hemorrhoids: Secondary | ICD-10-CM | POA: Diagnosis not present

## 2022-03-12 DIAGNOSIS — D125 Benign neoplasm of sigmoid colon: Secondary | ICD-10-CM | POA: Diagnosis not present

## 2022-03-12 DIAGNOSIS — D123 Benign neoplasm of transverse colon: Secondary | ICD-10-CM | POA: Diagnosis not present

## 2022-03-12 DIAGNOSIS — D124 Benign neoplasm of descending colon: Secondary | ICD-10-CM | POA: Diagnosis not present

## 2022-03-13 DIAGNOSIS — H524 Presbyopia: Secondary | ICD-10-CM | POA: Diagnosis not present

## 2022-03-13 DIAGNOSIS — E119 Type 2 diabetes mellitus without complications: Secondary | ICD-10-CM | POA: Diagnosis not present

## 2022-03-13 DIAGNOSIS — H25012 Cortical age-related cataract, left eye: Secondary | ICD-10-CM | POA: Diagnosis not present

## 2022-04-03 ENCOUNTER — Other Ambulatory Visit (HOSPITAL_COMMUNITY): Payer: Self-pay

## 2022-04-03 MED ORDER — OZEMPIC (2 MG/DOSE) 8 MG/3ML ~~LOC~~ SOPN
2.0000 mg | PEN_INJECTOR | SUBCUTANEOUS | 2 refills | Status: AC
  Filled 2022-04-03: qty 3, 28d supply, fill #0
  Filled 2022-05-09: qty 3, 28d supply, fill #1
  Filled 2022-06-02: qty 3, 28d supply, fill #2

## 2022-04-03 MED ORDER — OZEMPIC (0.25 OR 0.5 MG/DOSE) 2 MG/1.5ML ~~LOC~~ SOPN
0.5000 mg | PEN_INJECTOR | SUBCUTANEOUS | 1 refills | Status: DC
  Filled 2022-04-03: qty 1.5, 28d supply, fill #0

## 2022-05-06 ENCOUNTER — Other Ambulatory Visit (HOSPITAL_COMMUNITY): Payer: Self-pay

## 2022-05-09 ENCOUNTER — Other Ambulatory Visit (HOSPITAL_COMMUNITY): Payer: Self-pay

## 2022-06-02 ENCOUNTER — Other Ambulatory Visit (HOSPITAL_COMMUNITY): Payer: Self-pay

## 2022-07-14 ENCOUNTER — Other Ambulatory Visit (HOSPITAL_COMMUNITY): Payer: Self-pay

## 2022-08-04 ENCOUNTER — Other Ambulatory Visit (HOSPITAL_COMMUNITY): Payer: Self-pay

## 2022-09-10 DIAGNOSIS — J029 Acute pharyngitis, unspecified: Secondary | ICD-10-CM | POA: Diagnosis not present

## 2022-09-10 DIAGNOSIS — R0981 Nasal congestion: Secondary | ICD-10-CM | POA: Diagnosis not present

## 2022-09-10 DIAGNOSIS — E1169 Type 2 diabetes mellitus with other specified complication: Secondary | ICD-10-CM | POA: Diagnosis not present

## 2022-09-23 DIAGNOSIS — H6691 Otitis media, unspecified, right ear: Secondary | ICD-10-CM | POA: Diagnosis not present

## 2022-10-02 DIAGNOSIS — E1169 Type 2 diabetes mellitus with other specified complication: Secondary | ICD-10-CM | POA: Diagnosis not present

## 2022-10-02 DIAGNOSIS — J019 Acute sinusitis, unspecified: Secondary | ICD-10-CM | POA: Diagnosis not present

## 2022-10-02 DIAGNOSIS — Z1212 Encounter for screening for malignant neoplasm of rectum: Secondary | ICD-10-CM | POA: Diagnosis not present

## 2022-10-02 DIAGNOSIS — E785 Hyperlipidemia, unspecified: Secondary | ICD-10-CM | POA: Diagnosis not present

## 2022-10-31 ENCOUNTER — Other Ambulatory Visit (HOSPITAL_COMMUNITY): Payer: Self-pay

## 2022-10-31 MED ORDER — DEXCOM G7 SENSOR MISC
3 refills | Status: AC
  Filled 2022-10-31: qty 3, 30d supply, fill #0

## 2022-11-04 DIAGNOSIS — E1169 Type 2 diabetes mellitus with other specified complication: Secondary | ICD-10-CM | POA: Diagnosis not present

## 2023-02-03 DIAGNOSIS — E781 Pure hyperglyceridemia: Secondary | ICD-10-CM | POA: Diagnosis not present

## 2023-02-03 DIAGNOSIS — J019 Acute sinusitis, unspecified: Secondary | ICD-10-CM | POA: Diagnosis not present

## 2023-02-03 DIAGNOSIS — E1169 Type 2 diabetes mellitus with other specified complication: Secondary | ICD-10-CM | POA: Diagnosis not present

## 2023-02-03 DIAGNOSIS — Z1339 Encounter for screening examination for other mental health and behavioral disorders: Secondary | ICD-10-CM | POA: Diagnosis not present

## 2023-02-03 DIAGNOSIS — Z1331 Encounter for screening for depression: Secondary | ICD-10-CM | POA: Diagnosis not present

## 2023-02-03 DIAGNOSIS — I10 Essential (primary) hypertension: Secondary | ICD-10-CM | POA: Diagnosis not present

## 2023-02-03 DIAGNOSIS — D751 Secondary polycythemia: Secondary | ICD-10-CM | POA: Diagnosis not present

## 2023-02-03 DIAGNOSIS — Z0000 Encounter for general adult medical examination without abnormal findings: Secondary | ICD-10-CM | POA: Diagnosis not present

## 2023-02-03 DIAGNOSIS — Z23 Encounter for immunization: Secondary | ICD-10-CM | POA: Diagnosis not present

## 2023-03-17 DIAGNOSIS — H5203 Hypermetropia, bilateral: Secondary | ICD-10-CM | POA: Diagnosis not present

## 2023-03-17 DIAGNOSIS — H26103 Unspecified traumatic cataract, bilateral: Secondary | ICD-10-CM | POA: Diagnosis not present

## 2023-03-17 DIAGNOSIS — E119 Type 2 diabetes mellitus without complications: Secondary | ICD-10-CM | POA: Diagnosis not present

## 2023-03-26 NOTE — Progress Notes (Signed)
 [facility], [place], [place]   Adult Endocrinology Clinic Note   SUBJECTIVE:  This is a 57 y.o. male who presents for an initial consultation visit for diabetes mellitus type 2 ,last A1c 7.2%  Diabetes History: Diagnosed in 2012. Started treatment with oral agents. Started insulin  a few years ago. He reports morning BG 140-150 mg/dL.  He denies any known complications associate with diabetes.  He reports that his blood glucose control significantly proved after initiation of basal insulin .  He was previously using Dexcom but found it disruptive due to frequent blood glucose readings and alerts.  He otherwise feels well on current regimen.  A1c: A1c 7.4% today  Lab Results  Component Value Date   HGBA1C 10.0 (H) 02/28/2019   Lab Results  Component Value Date   HGBA1C 10.0 (H) 02/28/2019   POCA1C 7.4 (A) 03/27/2023   Current Medication Regimen:  Synjardy 12.04-999 mg twice daily Ozempic  2 mg weekly (injecting in 1 mg weekly, did not see much BG effect with higher dose) Semglee 18 units qAM, reports he will inject an extra 10 units in the evenings if blood sugars are running elevated  Previous therapies: Unsure  Adherence: Excellent   Blood Glucose Monitoring: Using One Touch meter  Results for orders placed or performed in visit on 03/27/23  POC Hemoglobin A1c  Result Value Ref Range   A1C % 7.4 (A) 4.2 - 5.6 %   A1C Information      A1C Diagnostic Criteria: Prediabetes:5.7% - 6.4% Diabetes:>=6.5% A1C Glycemic Goals: Older Adults: <7.0% Children and Adolescents: <7.0% Pregnant Women: <6.0%   Kit/Device Lot # [id number]    Kit/Device Expiration Date 11/10/2024   POC Glucose (Hemocue/NOVA)  Result Value Ref Range   Glucose, POC 195 (A) 70 - 125 mg/dL   Kit/Device Lot # [id number]    Kit/Device Expiration Date 07/09/2024    Hypoglycemia: Denies    Meal Plan: Patient eats 3 meals per day.  Breakfast: mutligrain taost, 1 egg, coffee  Lunch: low carb lunch  Dinner: skip  or protein Snacks: No  Beverages: Coke Zero, Coffee, Occassional wine    Exercise: Active and projects around the house   Health Maintenance/Complications: Microvascular Complications:  Retinopathy: Last eye exam - 2024, no DR   Nephropathy: Last urine microalbumin - 03 February 2023  Neuropathy: No  Macrovascular Complications: Denies hx MI, CVA, PAD  Gastroparesis: No  ARB: Yes  Statin: Yes-pravastatin 20, last LDL 116.  Previously had side effects with myalgias to another statin.  Review of Systems:  Complete ROS obtained and pertinent positives noted in the HPI  PMH, PSH, Social Hx, Family Hx, Meds and Allergies  I have independently reviewed the patient's past medical history, pastsurgical history, medication list, and social history noted below.   Past Medical History:  Diagnosis Date  . High triglycerides   . History of positive PPD   . Mixed hyperlipidemia   . Type 2 diabetes mellitus without complication, without long-term current use of insulin  (CMS/HCC)    History reviewed. No pertinent surgical history. Family History  Problem Relation Name Age of Onset  . Diabetes Neg Hx    . Hypertension Neg Hx    . Heart disease Neg Hx     Social History   Tobacco Use  . Smoking status: Never  . Smokeless tobacco: Never  Substance Use Topics  . Alcohol  use: Yes  . Drug use: No     Medications: Current Outpatient Medications  Medication Instructions  . amLODIPine-benazepril (  LOTREL 2.5-10) 2.5-10 mg per capsule 1 capsule, oral, Daily  . aspirin 81 mg EC tablet 1 tablet, Daily  . blood-glucose meter misc 1TEST AS DIRECTED  . empagliflozin-metFORMIN (Synjardy XR) 12.5-1,000 mg TBph 2 tablets, oral, Daily  . glucose blood (Blood Glucose Test) test strip 1 strip, miscellaneous, Daily  . glucose blood (Precision Q-I-D Test) test strip by Other route daily.  SABRA lancets 30 gauge misc 1 Stick, miscellaneous, Daily  . Ozempic  2 mg, subcutaneous, Weekly  . pravastatin  (PRAVACHOL) 40 mg, oral, Daily  . Semglee,insulin  glarg-yfgn,Pen 100 unit/mL (3 mL) pen Inject 20 units once daily then increase by 2 units every 3 days until fasting sugar is 130 or less.  TDD: 40 units  . triamterene-hydroCHLOROthiazide (MAXZIDE-25) 37.5-25 mg per tablet 1 tablet, oral, Daily    Allergies: No Known Allergies  Data  Vital Signs: BP 130/78 (BP Location: Left arm, Patient Position: Sitting)   Pulse 87   Ht 1.854 m (6' 1)   Wt 109 kg (241 lb)   SpO2 96%   BMI 31.80 kg/m   BMI Readings from Last 4 Encounters:  03/27/23 31.80 kg/m   Physical Exam: General: Alert, pleasant, well-nourished, no acute distress HEENT:  NCAT, EOMI, vision grossly intact CV: RRR, normal S1, S2, with no murmurs, rubs or gallops Resp: Normal respiratory effort, lungs clear to auscultation bilaterally Neuro: CN2-12 grossly intact, answers questions appropriately  Pysch: Pleasant mood, appropriate affect Extremities: Lower extremities warm, well perfused, no edema noted Skin: no rashes or wounds noted   Pertinent Labs: A1c Lab Results  Component Value Date   HGBA1C 10.0 (H) 02/28/2019   HGBA1C 8.3 (H) 03/22/2018   LIPIDS No results found for: CHOL, TRIG, HDL, LDLCALC  MICROALBUMIN No results found for: MACKEY CURRENT  Cr/GFR No results found for: CREATININE  Patient brought in labs from PCPs office dated February 2024 which were reviewed and showed a creatinine level 0.9, GFR 87.9, LDL 116, triglycerides 624, A1c 7.2, urine albumin 20  Assessment  Donald Carrillo is a 57 y.o.  male with a past medical history of above who presents for an initial consultation visit.  1. Type 2 diabetes mellitus with hyperglycemia, with long-term current use of insulin  (CMS/HCC)  POC Hemoglobin A1c   POC Glucose (Hemocue/NOVA)   Ambulatory referral to Cardiology   Semglee,insulin  glarg-yfgn,Pen 100 unit/mL (3 mL) pen   Ozempic  2 mg/dose (8 mg/3 mL) pnij    empagliflozin-metFORMIN (Synjardy XR) 12.5-1,000 mg TBph   pravastatin (PRAVACHOL) 40 mg tablet     Plan   1) Type 2 Diabetes, controlled, complicated by hyperglycemia - Current hemoglobin A1c goal is 7.5% or less without recurrent hypoglycemia.  Currently patient's glycemic control is at target on the current regimen.  He prefers to use glucometer versus CGM and he prefers to stay on basal insulin  due to improved glycemic control in general.  We discussed up titration of basal insulin  to target fasting blood glucose of 130 or less and he is agreeable.  He has mild GI side effects at the higher dose of Ozempic  thus he will remain on 1 mg weekly.  We discussed switching to Mounjaro in the future however this would not have a cardiovascular benefit at this time.  LDL is above target and diabetes thus I recommend increasing pravastatin to 40 mg.  He inquires about preventative testing in the setting of diabetes and heart thus I referred him to preventative cardiology.  BG monitoring: Continue glucometer checks  Labs:  Annual labs up-to-date and reviewed with labs he provided today's visit, next due February 2025  Meds: Continue Synjardy 12.04-999 mg twice daily Continue Ozempic  2 mg weekly (injecting in 1 mg weekly, did not see much BG effect with higher dose) Increase to Semglee 20 units qAM then increase by 2 units every 3 days until fasting sugar is 130 or less Do not inject 2nd dose of basal insulin   Increase pravastatin to 40 mg daily  Referral: Referral to preventative cardiology for risk stratification for heart disease in the setting of type 2 diabetes, hypertension and hyperlipidemia.  Discussed possible testing options which include labs for biomarkers, EKG, echo, stress testing or calcium artery scoring.  I recommend that he meet with cardiologist and decide which would be the best tests for him  Education/Counseling:  -Patient advised to check blood sugars premeals and bedtime and  bring glucometer to all visits   Return in about 3 months (around 06/26/2023) for DMT2, glucometer.

## 2023-03-27 DIAGNOSIS — Z7985 Long-term (current) use of injectable non-insulin antidiabetic drugs: Secondary | ICD-10-CM | POA: Diagnosis not present

## 2023-03-27 DIAGNOSIS — E1165 Type 2 diabetes mellitus with hyperglycemia: Secondary | ICD-10-CM | POA: Diagnosis not present

## 2023-03-27 DIAGNOSIS — Z794 Long term (current) use of insulin: Secondary | ICD-10-CM | POA: Diagnosis not present

## 2023-03-27 DIAGNOSIS — Z7984 Long term (current) use of oral hypoglycemic drugs: Secondary | ICD-10-CM | POA: Diagnosis not present

## 2023-05-18 DIAGNOSIS — E785 Hyperlipidemia, unspecified: Secondary | ICD-10-CM | POA: Diagnosis not present

## 2023-05-18 DIAGNOSIS — E1165 Type 2 diabetes mellitus with hyperglycemia: Secondary | ICD-10-CM | POA: Diagnosis not present

## 2023-05-18 DIAGNOSIS — I10 Essential (primary) hypertension: Secondary | ICD-10-CM | POA: Diagnosis not present

## 2023-05-18 DIAGNOSIS — E119 Type 2 diabetes mellitus without complications: Secondary | ICD-10-CM | POA: Diagnosis not present

## 2023-05-18 DIAGNOSIS — R0789 Other chest pain: Secondary | ICD-10-CM | POA: Diagnosis not present

## 2023-05-18 DIAGNOSIS — Z794 Long term (current) use of insulin: Secondary | ICD-10-CM | POA: Diagnosis not present

## 2023-05-19 NOTE — Progress Notes (Signed)
 Cardiology Clinic Note  [facility] - [facility] [address] [place]  [address] Dept: [phone]  REQUESTING PHYSICIAN: Jacqueline Carlos Lenis*  REASON FOR CONSULTATION:  Chief Complaint  Patient presents with  . New Pt    Portion of this note were dictated using DRAGON voices recognition software. Please disregard any errors in transcription .  ASSESSMENT AND PLAN: Preventive care Hx of HTN, DM2, HLD -would recommend CCTA given preventive nature -Would need pretreatment with metoprolol -RTC 6 months for follow up -The above plan was discussed with the patient in detail. He expresses understanding and agrees with the plan.  Thank you for the opportunity of assisting in the care of Donald Carrillo.  Please do not hesitate to call if you have any questions.  Sincerely,   Lennart RAMAN. Debora, MD Cardiology  [facility] 05/19/2023  9:43 AM   HISTORY AND PRESENT ILLNESS:    Donald Carrillo is a very pleasant 57 year old male with PMHx of HTN, IDDM2, HLD. He is here to establish care for prevention. Family history is significant for coronary disease in his brothers. No symptoms currently.  CORONARY RISK FACTORS:  advanced age (older than 29 for men, 95 for women), diabetes mellitus, dyslipidemia, hypertension, male gender, and obesity (BMI >= 30 kg/m2)  Prior Cardiac Workup: EKG today 05/18/2023: NSR, normal EKG  Past Medical History:  Diagnosis Date  . High triglycerides   . History of positive PPD   . Mixed hyperlipidemia   . Type 2 diabetes mellitus without complication, without long-term current use of insulin  (CMS/HCC)     No past surgical history on file.       Family History  Problem Relation Name Age of Onset  . Diabetes Neg Hx    . Hypertension Neg Hx    . Heart disease Neg Hx      Current Outpatient Medications  Medication Sig Dispense Refill  . amLODIPine-benazepril (LOTREL 2.5-10) 2.5-10 mg per capsule  Take 1 capsule by mouth Once Daily. (Patient taking differently: Take 1 capsule by mouth daily. Amlodipine 10 mg) 90 capsule 3  . ascorbic acid (VITAMIN C) 500 mg tablet Take 500 mg by mouth daily.    SABRA aspirin 81 mg EC tablet Take 1 tablet by mouth Once Daily.    . benazepriL (LOTENSIN) 20 mg tablet Take 20 mg by mouth daily.    . cholecalciferol (VITAMIN D3) 125 mcg (5,000 unit) capsule Take 5,000 Units by mouth daily.    . empagliflozin-metFORMIN (Synjardy XR) 12.5-1,000 mg TBph Take 2 tablets by mouth daily. 180 tablet 1  . glucose blood (Blood Glucose Test) test strip 1 strip by miscellaneous route Once Daily. 100 each 2  . lancets 30 gauge misc 1 Stick by miscellaneous route Once Daily. 100 each 5  . magnesium chloride (MAG-DELAY) 64 mg (magnesium) TbEC DR tablet Take 128 mg by mouth daily.    SABRA omega 3-dha-epa-fish oil (OMEGA 3) 1,000 mg DR capsule Take 1 capsule by mouth daily.    . Ozempic  2 mg/dose (8 mg/3 mL) pnij Inject 0.75 mL (2 mg total) under the skin once a week. 3 mL 11  . pravastatin (PRAVACHOL) 40 mg tablet Take 1 tablet (40 mg total) by mouth daily. 90 tablet 3  . Semglee,insulin  glarg-yfgn,Pen 100 unit/mL (3 mL) pen Inject 20 units once daily then increase by 2 units every 3 days until fasting sugar is 130 or less.  TDD: 40  units 12 mL 11  . zinc  sulfate (ZINCATE) 220 mg capsule Take 220 mg by mouth daily.    . blood-glucose meter misc 1TEST AS DIRECTED (Patient not taking: Reported on 05/18/2023) 1 each 0  . triamterene-hydroCHLOROthiazide (MAXZIDE-25) 37.5-25 mg per tablet Take 1 tablet by mouth Once Daily. (Patient not taking: Reported on 05/18/2023) 90 tablet 1   No current facility-administered medications for this visit.    No Known Allergies   REVIEW OF SYSTEMS:  All other systems were reviewed and were negative in detail except as noted in the HPI.  PHYSICAL EXAMINATION: Vitals:   05/18/23 1459  BP: 131/86  Pulse: 85  SpO2: 97%   General:  Resting comfortably  in NAD Skin:  Warm & dry Neuro:  Alert HEENT:  Sclera anicteric.   Resp:  Resp even and nonlabored.  Lungs sounds clear throughout CV:  S1S2 regular rate and rhythm, no murmur noted, No gallops or rubs Abd:  Soft, nontender, NABS Ext:  No cyanosis or edema MSK:  Moving all extremities.   Neck:  No thyromegaly.  No JVD.  no carotid bruit(s) Pulses:  2+ and symmetrical upper and lower extremities.   Heme: No signs of bleeding or excessive bruising.  Labs/Imaging: No results found for: CKTOTAL, CKMB, CKMBINDEX   .    No results found for: PROBNP, MG, AST, ALT, BILITOT, INR, TSH, CHOL, LDL, HDL

## 2023-05-21 DIAGNOSIS — R0789 Other chest pain: Secondary | ICD-10-CM | POA: Diagnosis not present

## 2023-05-21 DIAGNOSIS — E1165 Type 2 diabetes mellitus with hyperglycemia: Secondary | ICD-10-CM | POA: Diagnosis not present

## 2023-05-21 DIAGNOSIS — I10 Essential (primary) hypertension: Secondary | ICD-10-CM | POA: Diagnosis not present

## 2023-05-21 DIAGNOSIS — Z794 Long term (current) use of insulin: Secondary | ICD-10-CM | POA: Diagnosis not present

## 2023-06-09 ENCOUNTER — Other Ambulatory Visit (HOSPITAL_COMMUNITY): Payer: Self-pay

## 2023-06-09 MED ORDER — INSULIN PEN NEEDLE 31G X 8 MM MISC
3 refills | Status: AC
  Filled 2023-06-09: qty 100, 90d supply, fill #0

## 2024-01-28 DIAGNOSIS — E781 Pure hyperglyceridemia: Secondary | ICD-10-CM | POA: Diagnosis not present

## 2024-01-28 DIAGNOSIS — E1169 Type 2 diabetes mellitus with other specified complication: Secondary | ICD-10-CM | POA: Diagnosis not present

## 2024-01-28 DIAGNOSIS — Z125 Encounter for screening for malignant neoplasm of prostate: Secondary | ICD-10-CM | POA: Diagnosis not present

## 2024-01-28 DIAGNOSIS — E785 Hyperlipidemia, unspecified: Secondary | ICD-10-CM | POA: Diagnosis not present

## 2024-01-28 DIAGNOSIS — I10 Essential (primary) hypertension: Secondary | ICD-10-CM | POA: Diagnosis not present

## 2024-02-09 DIAGNOSIS — E1169 Type 2 diabetes mellitus with other specified complication: Secondary | ICD-10-CM | POA: Diagnosis not present

## 2024-02-09 DIAGNOSIS — D751 Secondary polycythemia: Secondary | ICD-10-CM | POA: Diagnosis not present

## 2024-02-09 DIAGNOSIS — Z1339 Encounter for screening examination for other mental health and behavioral disorders: Secondary | ICD-10-CM | POA: Diagnosis not present

## 2024-02-09 DIAGNOSIS — R82998 Other abnormal findings in urine: Secondary | ICD-10-CM | POA: Diagnosis not present

## 2024-02-09 DIAGNOSIS — Z0000 Encounter for general adult medical examination without abnormal findings: Secondary | ICD-10-CM | POA: Diagnosis not present

## 2024-02-09 DIAGNOSIS — E781 Pure hyperglyceridemia: Secondary | ICD-10-CM | POA: Diagnosis not present

## 2024-02-09 DIAGNOSIS — I10 Essential (primary) hypertension: Secondary | ICD-10-CM | POA: Diagnosis not present

## 2024-02-09 DIAGNOSIS — Z1331 Encounter for screening for depression: Secondary | ICD-10-CM | POA: Diagnosis not present

## 2024-02-09 DIAGNOSIS — Z23 Encounter for immunization: Secondary | ICD-10-CM | POA: Diagnosis not present

## 2024-02-09 DIAGNOSIS — E785 Hyperlipidemia, unspecified: Secondary | ICD-10-CM | POA: Diagnosis not present

## 2024-02-09 DIAGNOSIS — E669 Obesity, unspecified: Secondary | ICD-10-CM | POA: Diagnosis not present

## 2024-09-07 DIAGNOSIS — I10 Essential (primary) hypertension: Secondary | ICD-10-CM | POA: Diagnosis not present

## 2024-09-07 DIAGNOSIS — E781 Pure hyperglyceridemia: Secondary | ICD-10-CM | POA: Diagnosis not present

## 2024-09-07 DIAGNOSIS — E1169 Type 2 diabetes mellitus with other specified complication: Secondary | ICD-10-CM | POA: Diagnosis not present

## 2024-09-07 DIAGNOSIS — Z23 Encounter for immunization: Secondary | ICD-10-CM | POA: Diagnosis not present

## 2024-09-07 DIAGNOSIS — E669 Obesity, unspecified: Secondary | ICD-10-CM | POA: Diagnosis not present

## 2024-09-07 DIAGNOSIS — D751 Secondary polycythemia: Secondary | ICD-10-CM | POA: Diagnosis not present

## 2024-09-07 DIAGNOSIS — E785 Hyperlipidemia, unspecified: Secondary | ICD-10-CM | POA: Diagnosis not present

## 2024-09-07 DIAGNOSIS — R7989 Other specified abnormal findings of blood chemistry: Secondary | ICD-10-CM | POA: Diagnosis not present

## 2024-12-30 NOTE — Progress Notes (Signed)
 Donald Carrillo                                          MRN: [id number]   12/30/2024   The [facility] Quality Team Specialist reviewed this patient medical record for the purposes of chart review for care gap closure. The following were reviewed: chart review for care gap closure-glycemic status assessment.    [facility] Quality Team
